# Patient Record
Sex: Male | Born: 1995 | Race: Black or African American | Hispanic: No | Marital: Single | Smoking: Current some day smoker
Health system: Southern US, Community
[De-identification: ages and names within clinical notes are randomized; demographics above are authoritative.]

---

## 2020-06-23 ENCOUNTER — Encounter (HOSPITAL_COMMUNITY): Payer: Self-pay | Admitting: Emergency Medicine

## 2020-06-23 ENCOUNTER — Other Ambulatory Visit: Payer: Self-pay

## 2020-06-23 ENCOUNTER — Emergency Department (HOSPITAL_COMMUNITY)
Admission: EM | Admit: 2020-06-23 | Discharge: 2020-06-24 | Disposition: A | Discharge status: Home / Self Care | Payer: No Typology Code available for payment source | Attending: Emergency Medicine | Admitting: Emergency Medicine

## 2020-06-23 DIAGNOSIS — R202 Paresthesia of skin: Secondary | ICD-10-CM | POA: Diagnosis not present

## 2020-06-23 DIAGNOSIS — Y998 Other external cause status: Secondary | ICD-10-CM | POA: Diagnosis not present

## 2020-06-23 DIAGNOSIS — Z23 Encounter for immunization: Secondary | ICD-10-CM | POA: Insufficient documentation

## 2020-06-23 DIAGNOSIS — S0101XA Laceration without foreign body of scalp, initial encounter: Secondary | ICD-10-CM | POA: Diagnosis present

## 2020-06-23 DIAGNOSIS — Y9289 Other specified places as the place of occurrence of the external cause: Secondary | ICD-10-CM | POA: Diagnosis not present

## 2020-06-23 DIAGNOSIS — W503XXA Accidental bite by another person, initial encounter: Secondary | ICD-10-CM

## 2020-06-23 DIAGNOSIS — Y9389 Activity, other specified: Secondary | ICD-10-CM | POA: Diagnosis not present
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
## 2020-06-24 ENCOUNTER — Emergency Department (HOSPITAL_COMMUNITY): Payer: No Typology Code available for payment source

## 2020-06-24 MED ORDER — METHOCARBAMOL 500 MG PO TABS: 500.0000 mg | ORAL_TABLET | Freq: Three times a day (TID) | ORAL | 0 refills | Status: AC | PRN

## 2020-06-24 MED ORDER — AMOXICILLIN-POT CLAVULANATE 875-125 MG PO TABS
1.0000 | ORAL_TABLET | Freq: Two times a day (BID) | ORAL | 0 refills | Status: AC
Start: 2020-06-24 — End: ?

## 2020-06-24 MED ORDER — TETANUS-DIPHTH-ACELL PERTUSSIS 5-2.5-18.5 LF-MCG/0.5 IM SUSP
0.5000 mL | Freq: Once | INTRAMUSCULAR | Status: AC
  Administered 2020-06-24: 0.5 mL via INTRAMUSCULAR
  Filled 2020-06-24: qty 0.5

## 2020-06-24 MED ORDER — LIDOCAINE-EPINEPHRINE 1 %-1:100000 IJ SOLN
10.0000 mL | Freq: Once | INTRAMUSCULAR | Status: DC
  Filled 2020-06-24: qty 1

## 2020-06-24 NOTE — ED Notes (Signed)
Patient verbalizes understanding of discharge instructions. Opportunity for questioning and answers were provided. Armband removed by staff, pt discharged from ED ambulatory.   
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
## 2020-06-24 NOTE — Discharge Instructions (Addendum)
You may have diarrhea from the antibiotics.  It is very important that you continue to take the antibiotics even if you get diarrhea unless a medical professional tells you that you may stop taking them.  If you stop too early the bacteria you are being treated for will become stronger and you may need different, more powerful antibiotics that have more side effects and worsening diarrhea.  Please stay well hydrated and consider probiotics as they may decrease the severity of your diarrhea.   It is very important that you take your antibiotics.  The human mouth has many bad bacteria that can cause serious infections!   Please take Ibuprofen (Advil, motrin) and Tylenol (acetaminophen) to relieve your pain.  You may take up to 600 MG (3 pills) of normal strength ibuprofen every 8 hours as needed.  In between doses of ibuprofen you make take tylenol, up to 1,000 mg (two extra strength pills).  Do not take more than 3,000 mg tylenol in a 24 hour period.  Please check all medication labels as many medications such as pain and cold medications may contain tylenol.  Do not drink alcohol while taking these medications.  Do not take other NSAID'S while taking ibuprofen (such as aleve or naproxen).  Please take ibuprofen with food to decrease stomach upset.  You are being prescribed a medication which may make you sleepy. For 24 hours after one dose please do not drive, operate heavy machinery, care for a small child with out another adult present, or perform any activities that may cause harm to you or someone else if you were to fall asleep or be impaired.

## 2020-07-17 ENCOUNTER — Emergency Department (HOSPITAL_COMMUNITY): Admission: EM | Admit: 2020-07-17 | Discharge: 2020-07-17 | Discharge status: Against Medical Advice | Payer: Self-pay

## 2020-07-17 NOTE — ED Notes (Signed)
Patient called for triage x3, unable to locate patient.

## 2020-07-17 NOTE — ED Notes (Signed)
Called pt for triage with no answer  

## 2020-09-05 ENCOUNTER — Other Ambulatory Visit: Payer: Self-pay

## 2020-09-05 ENCOUNTER — Emergency Department (HOSPITAL_COMMUNITY)
Admission: EM | Admit: 2020-09-05 | Discharge: 2020-09-05 | Disposition: A | Discharge status: Home / Self Care | Payer: 59 | Attending: Emergency Medicine | Admitting: Emergency Medicine

## 2020-09-05 ENCOUNTER — Encounter (HOSPITAL_COMMUNITY): Payer: Self-pay

## 2020-09-05 DIAGNOSIS — K0889 Other specified disorders of teeth and supporting structures: Secondary | ICD-10-CM | POA: Diagnosis present

## 2020-09-05 DIAGNOSIS — F17210 Nicotine dependence, cigarettes, uncomplicated: Secondary | ICD-10-CM | POA: Insufficient documentation

## 2020-09-05 MED ORDER — PENICILLIN V POTASSIUM 500 MG PO TABS: 500.0000 mg | ORAL_TABLET | Freq: Four times a day (QID) | ORAL | 0 refills | Status: AC

## 2020-09-05 NOTE — ED Provider Notes (Signed)
Elijah Martin   CSN: [id number] Arrival date & time: 09/05/20  1833     History Chief Complaint  Patient presents with  . Dental Pain    Elijah Martin is a 24 y.o. male.  HPI   Patient with no significant medical history presents to the emergency department with chief complaint of dental pain x1 week.  Patient states he has a decaying tooth on the right upper jaw and states it is infected which is causing his pain.  He denies any trauma to the area.  Patient states he was just seen at his dentist who extracted a few teeth on the left upper jaw and told him that he had a few decaying teeth on the right upper jaw.  He has been taking ibuprofen Tylenol without any relief.  He also endorses that he started to feel some facial swelling on the right side and that it hurts.  He denies torticollis, trismus, difficulty controlling his secretions, denies tongue or throat swelling.  Patient denies  fever or chills headache, fever, chills, shortness of breath, chest pain, vomiting, nausea vomiting, diarrhea, pedal edema.  History reviewed. No pertinent past medical history.  There are no problems to display for this patient.   History reviewed. No pertinent surgical history.     Family History  Family history unknown: Yes    Social History   Tobacco Use  . Smoking status: Current Some Day Smoker    Types: Cigarettes  . Smokeless tobacco: Never Used  Vaping Use  . Vaping Use: Never used  Substance Use Topics  . Alcohol use: Yes  . Drug use: Yes    Types: Marijuana    Home Medications Prior to Admission medications   Medication Sig Start Date End Date Taking? Authorizing Provider  amoxicillin-clavulanate (AUGMENTIN) 875-125 MG tablet Take 1 tablet by mouth every 12 (twelve) hours. 06/24/20   Cristina Gong, PA-C  methocarbamol (ROBAXIN) 500 MG tablet Take 1 tablet (500 mg total) by mouth every 8 (eight) hours as needed for  muscle spasms. 06/24/20   Cristina Gong, PA-C  penicillin v potassium (VEETID) 500 MG tablet Take 1 tablet (500 mg total) by mouth 4 (four) times daily for 7 days. 09/05/20 09/12/20  Carroll Sage, PA-C    Allergies    Patient has no known allergies.  Review of Systems   Review of Systems  Constitutional: Negative for chills and fever.  HENT: Positive for dental problem and facial swelling. Negative for congestion, sore throat, tinnitus, trouble swallowing and voice change.   Eyes: Negative for visual disturbance.  Respiratory: Negative for shortness of breath.   Cardiovascular: Negative for chest pain.  Gastrointestinal: Negative for abdominal pain, diarrhea, nausea and vomiting.  Genitourinary: Negative for enuresis, flank pain and frequency.  Musculoskeletal: Negative for back pain.  Skin: Negative for rash.  Neurological: Negative for dizziness and headaches.  Hematological: Does not bruise/bleed easily.    Physical Exam Updated Vital Signs BP 118/64 (BP Location: Right Arm)   Pulse 72   Temp 98.7 F (37.1 C) (Oral)   Resp 16   Ht 6' (1.829 m)   Wt 72.6 kg   SpO2 98%   BMI 21.70 kg/m   Physical Exam Vitals and nursing Martin reviewed.  Constitutional:      General: He is not in acute distress.    Appearance: He is not ill-appearing.  HENT:     Head: Normocephalic and atraumatic.  Nose: No congestion.     Mouth/Throat:     Mouth: Mucous membranes are moist.     Pharynx: Oropharynx is clear. No oropharyngeal exudate or posterior oropharyngeal erythema.     Comments: Oropharynx was visualized, tongue and uvula are both midline, patient was controlling his own secretions out difficulty.  He had multiple decaying teeth noted on the right upper jaw.  Gumlines were evaluated they are erythematous, tender to palpation, no fluctuance or indurations felt. Eyes:     General: No scleral icterus. Cardiovascular:     Rate and Rhythm: Normal rate and regular rhythm.      Pulses: Normal pulses.     Heart sounds: No murmur heard.  No friction rub. No gallop.   Pulmonary:     Effort: No respiratory distress.     Breath sounds: No wheezing, rhonchi or rales.  Abdominal:     General: There is no distension.     Tenderness: There is no abdominal tenderness. There is no guarding.  Musculoskeletal:        General: No swelling.  Skin:    General: Skin is warm and dry.     Findings: No rash.  Neurological:     Mental Status: He is alert.  Psychiatric:        Mood and Affect: Mood normal.     ED Results / Procedures / Treatments   Labs (all labs ordered are listed, but only abnormal results are displayed) Labs Reviewed - No data to display  EKG None  Radiology No results found.  Procedures Procedures (including critical care time)  Medications Ordered in ED Medications - No data to display  ED Course  I have reviewed the triage vital signs and the nursing notes.  Pertinent labs & imaging results that were available during my care of the patient were reviewed by me and considered in my medical decision making (see chart for details).    MDM Rules/Calculators/A&P                          Patient presents with right upper dental pain and facial pain.  He is alert, did not appear in acute distress, vital signs reassuring.  Due to benign physical exam reassuring vital signs further lab work and imaging were not warranted.  I have low suspicion for peritonsillar abscess, retropharyngeal abscess, or Ludwig angina as oropharynx was visualized tongue and uvula were both midline, there is no exudates, erythema or edema noted in the posterior pillars or on/ around tonsils.  Low suspicion for an abscess as gumline were palpated no fluctuance or induration felt.  Low suspicion for periorbital or orbital cellulitis as patient face had no erythematous, patient EOMs were intact, he had no pain with eye movement.  Low suspicion for systemic infection as  patient is nontoxic-appearing, vital signs reassuring, no obvious source infection on exam.  I suspect patient suffering from dental cavity will start him on antibiotics and follow-up with his dentist for further evaluation.  Vital signs have remained stable, no indication for hospital admission.   Patient given at home care as well strict return precautions.  Patient verbalized that they understood agreed to said plan.   Final Clinical Impression(s) / ED Diagnoses Final diagnoses:  Pain, dental    Rx / DC Orders ED Discharge Orders         Ordered    penicillin v potassium (VEETID) 500 MG tablet  4 times daily  09/05/20 1920           Carroll Sage, PA-C 09/05/20 1959    Melene Plan, DO 09/05/20 2104

## 2020-09-05 NOTE — ED Triage Notes (Signed)
Patient reports that he had upper right dental pain x 1 week. Patient states he now has pain on the right side of his face.

## 2020-09-05 NOTE — Discharge Instructions (Signed)
Seen here for dental pain.  Start you on antibiotics please take as prescribed.  Recommend taking over-the-counter pain medications like ibuprofen or Tylenol every 6 as needed please follow dosing the back of bottle.  It is important that you follow-up with your dentist for further evaluation management.  Come back to the emergency department if you develop chest pain, shortness of breath, severe abdominal pain, uncontrolled nausea, vomiting, diarrhea.
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
